# Patient Record
Sex: Female | Born: 1955 | Hispanic: No | Smoking: Never smoker
Health system: Southern US, Community
[De-identification: ages and names within clinical notes are randomized; demographics above are authoritative.]

## PROBLEM LIST (undated history)

## (undated) DIAGNOSIS — I10 Essential (primary) hypertension: Secondary | ICD-10-CM

---

## 2016-11-20 ENCOUNTER — Emergency Department (HOSPITAL_BASED_OUTPATIENT_CLINIC_OR_DEPARTMENT_OTHER): Admit: 2016-11-20 | Discharge: 2016-11-20 | Disposition: A | Discharge status: Home / Self Care | Payer: Self-pay

## 2016-11-20 ENCOUNTER — Emergency Department (HOSPITAL_COMMUNITY): Payer: Self-pay

## 2016-11-20 ENCOUNTER — Encounter (HOSPITAL_COMMUNITY): Payer: Self-pay

## 2016-11-20 ENCOUNTER — Emergency Department (HOSPITAL_COMMUNITY)
Admission: EM | Admit: 2016-11-20 | Discharge: 2016-11-20 | Disposition: A | Discharge status: Home / Self Care | Payer: Self-pay | Attending: Emergency Medicine | Admitting: Emergency Medicine

## 2016-11-20 DIAGNOSIS — S32020A Wedge compression fracture of second lumbar vertebra, initial encounter for closed fracture: Secondary | ICD-10-CM | POA: Insufficient documentation

## 2016-11-20 DIAGNOSIS — Y939 Activity, unspecified: Secondary | ICD-10-CM | POA: Insufficient documentation

## 2016-11-20 DIAGNOSIS — I10 Essential (primary) hypertension: Secondary | ICD-10-CM | POA: Insufficient documentation

## 2016-11-20 DIAGNOSIS — Y999 Unspecified external cause status: Secondary | ICD-10-CM | POA: Insufficient documentation

## 2016-11-20 DIAGNOSIS — R609 Edema, unspecified: Secondary | ICD-10-CM

## 2016-11-20 DIAGNOSIS — X58XXXA Exposure to other specified factors, initial encounter: Secondary | ICD-10-CM | POA: Insufficient documentation

## 2016-11-20 DIAGNOSIS — R600 Localized edema: Secondary | ICD-10-CM | POA: Insufficient documentation

## 2016-11-20 DIAGNOSIS — Y929 Unspecified place or not applicable: Secondary | ICD-10-CM | POA: Insufficient documentation

## 2016-11-20 HISTORY — DX: Essential (primary) hypertension: I10

## 2016-11-20 LAB — CBC WITH DIFFERENTIAL/PLATELET
BASOS ABS: 0 10*3/uL (ref 0.0–0.1)
Basophils Relative: 0 %
Eosinophils Absolute: 0.3 10*3/uL (ref 0.0–0.7)
Eosinophils Relative: 3 %
HEMATOCRIT: 40.7 % (ref 36.0–46.0)
HEMOGLOBIN: 13.2 g/dL (ref 12.0–15.0)
LYMPHS ABS: 3.5 10*3/uL (ref 0.7–4.0)
LYMPHS PCT: 36 %
MCH: 27.8 pg (ref 26.0–34.0)
MCHC: 32.4 g/dL (ref 30.0–36.0)
MCV: 85.9 fL (ref 78.0–100.0)
Monocytes Absolute: 0.8 10*3/uL (ref 0.1–1.0)
Monocytes Relative: 8 %
NEUTROS ABS: 5 10*3/uL (ref 1.7–7.7)
NEUTROS PCT: 53 %
PLATELETS: 300 10*3/uL (ref 150–400)
RBC: 4.74 MIL/uL (ref 3.87–5.11)
RDW: 14.3 % (ref 11.5–15.5)
WBC: 9.6 10*3/uL (ref 4.0–10.5)

## 2016-11-20 LAB — BASIC METABOLIC PANEL
ANION GAP: 6 (ref 5–15)
BUN: 10 mg/dL (ref 6–20)
CHLORIDE: 104 mmol/L (ref 101–111)
CO2: 29 mmol/L (ref 22–32)
Calcium: 9.8 mg/dL (ref 8.9–10.3)
Creatinine, Ser: 0.69 mg/dL (ref 0.44–1.00)
GFR calc Af Amer: >60 mL/min (ref >60)
Glucose, Bld: 96 mg/dL (ref 65–99)
POTASSIUM: 4.5 mmol/L (ref 3.5–5.1)
SODIUM: 139 mmol/L (ref 135–145)

## 2016-11-20 MED ORDER — MELOXICAM 7.5 MG PO TABS: 7.5000 mg | ORAL_TABLET | Freq: Every day | ORAL | 0 refills | Status: AC

## 2016-11-20 NOTE — ED Provider Notes (Signed)
[facility] DEPT Provider Note   CSN: [id number] Arrival date & time: 11/20/16  1505     History   Chief Complaint Chief Complaint  Patient presents with  . Back Pain  . Leg Swelling    HPI Karen Malone is a 61 y.o. female.  The history is provided by the patient. No language interpreter was used.  Back Pain   This is a new problem. The current episode started more than 1 week ago. The problem occurs constantly. The problem has been gradually worsening. The pain is associated with no known injury. The pain is present in the lumbar spine. The quality of the pain is described as aching. The pain is moderate. The symptoms are aggravated by bending. Associated symptoms include abdominal pain. Pertinent negatives include no chest pain. She has tried nothing for the symptoms.  Pt moved here from [place] 1 week ago.  Pt reports flying for 18 hours.  No injuries.   Past Medical History:  Diagnosis Date  . Hypertension     There are no active problems to display for this patient.   History reviewed. No pertinent surgical history.  OB History    No data available       Home Medications    Prior to Admission medications   Medication Sig Start Date End Date Taking? Authorizing Provider  meloxicam (MOBIC) 7.5 MG tablet Take 1 tablet (7.5 mg total) by mouth daily. 11/20/16   Elson AreasSofia, Ezel Vallone K, PA-C    Family History No family history on file.  Social History Social History  Substance Use Topics  . Smoking status: Never Smoker  . Smokeless tobacco: Never Used  . Alcohol use No     Allergies   Other   Review of Systems Review of Systems  Cardiovascular: Negative for chest pain.  Gastrointestinal: Positive for abdominal pain.  Musculoskeletal: Positive for back pain.  All other systems reviewed and are negative.    Physical Exam Updated Vital Signs BP (!) 102/57 (BP Location: Right Arm)   Pulse 75   Temp 97.9 F (36.6 C) (Oral)   Resp 18   Ht 5\' 4"  (1.626  m)   Wt 72.6 kg (160 lb)   SpO2 95%   BMI 27.46 kg/m   Physical Exam  Constitutional: She appears well-developed and well-nourished. No distress.  HENT:  Head: Normocephalic and atraumatic.  Eyes: Conjunctivae are normal.  Neck: Neck supple.  Cardiovascular: Normal rate and regular rhythm.   No murmur heard. Pulmonary/Chest: Effort normal and breath sounds normal. No respiratory distress.  Abdominal: Soft. There is no tenderness.  Musculoskeletal: She exhibits tenderness. She exhibits no edema.  Diffusely tender ls spine,  Tender bilat knees,  Negative homan's bilat   Neurological: She is alert.  Skin: Skin is warm and dry.  Psychiatric: She has a normal mood and affect.  Nursing note and vitals reviewed.    ED Treatments / Results  Labs (all labs ordered are listed, but only abnormal results are displayed) Labs Reviewed  BASIC METABOLIC PANEL  CBC WITH DIFFERENTIAL/PLATELET    EKG  EKG Interpretation None       Radiology Dg Lumbar Spine Complete  Result Date: 11/20/2016 CLINICAL DATA:  Pain in the back for 1 week EXAM: LUMBAR SPINE - COMPLETE 4+ VIEW COMPARISON:  None. FINDINGS: SI joints are symmetric. Lumbar alignment is within normal limits. Marked degenerative changes at L5-S1. Age indeterminate superior endplate compression deformities at L2 and L3. Superior endplate compression at L5 with mild  diffuse loss of height of the L5 vertebral body. Aortic atherosclerosis. IMPRESSION: 1. Age indeterminate superior endplate compression fractures of L2 and L3 2. Age-indeterminate superior endplate deformity L5, mild diffuse loss of vertebral body height at L5 3. 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   On: 11/20/2016 20:39    Procedures Procedures (including critical care time)  Medications Ordered in ED Medications - No data to display   Initial Impression / Assessment and Plan / ED Course  I have reviewed the  triage vital signs and the nursing notes.  Pertinent labs & imaging results that were available during my care of the patient were reviewed by me and considered in my medical decision making (see chart for details).    Bilat dopplers are negative.  LS spine xray shows old compression fractures and arthritic changes.   I will try pt on meloxicam.  I advised follow up with primary care MD.   Final Clinical Impressions(s) / ED Diagnoses   Final diagnoses:  Closed compression fracture of second lumbar vertebra, initial encounter [facility](HCC)  Leg edema    New Prescriptions Discharge Medication List as of 11/20/2016  8:58 PM    START taking these medications   Details  meloxicam (MOBIC) 7.5 MG tablet Take 1 tablet (7.5 mg total) by mouth daily., Starting Thu 11/20/2016, Print      An After Visit Summary was printed and given to the patient.   Elson AreasSofia, Eliazer Hemphill K, PA-C 11/20/16 2227    Elson AreasSofia, Moncerrat Burnstein K, PA-C 11/20/16 2230    Rolland PorterJames, Mark, MD 12/01/16 [phone]

## 2016-11-20 NOTE — Discharge Instructions (Signed)
Return if any problems.  Elevate legs to decrease swelling. Avoid salty food.

## 2016-11-20 NOTE — ED Triage Notes (Signed)
Per Pt, pt is coming from living with her family. She recently came over to [place] from [place] five days ago. Pt reports the traveling being 18 hours. Since the traveling, pt reports lower back pain and bilateral leg swelling. Family has been treating leg swelling with epsom salts and the swelling has slightly decreased, but the back pain has continued. Denies urinary symptoms.

## 2016-11-20 NOTE — Progress Notes (Signed)
VASCULAR LAB PRELIMINARY  PRELIMINARY  PRELIMINARY  PRELIMINARY  Bilateral lower extremity venous duplex completed.    Preliminary report:  There is no DVT or SVT noted in the bilateral lower extremities.   Wyatte Dames, RVT 11/20/2016, 7:05 PM

## 2017-01-05 ENCOUNTER — Emergency Department (HOSPITAL_COMMUNITY): Payer: Self-pay

## 2017-01-05 ENCOUNTER — Emergency Department (HOSPITAL_COMMUNITY)
Admission: EM | Admit: 2017-01-05 | Discharge: 2017-01-06 | Disposition: A | Discharge status: Home / Self Care | Payer: Self-pay | Attending: Emergency Medicine | Admitting: Emergency Medicine

## 2017-01-05 ENCOUNTER — Encounter (HOSPITAL_COMMUNITY): Payer: Self-pay | Admitting: Nurse Practitioner

## 2017-01-05 DIAGNOSIS — R51 Headache: Secondary | ICD-10-CM | POA: Insufficient documentation

## 2017-01-05 DIAGNOSIS — R0609 Other forms of dyspnea: Secondary | ICD-10-CM | POA: Insufficient documentation

## 2017-01-05 DIAGNOSIS — I10 Essential (primary) hypertension: Secondary | ICD-10-CM | POA: Insufficient documentation

## 2017-01-05 DIAGNOSIS — R519 Headache, unspecified: Secondary | ICD-10-CM

## 2017-01-05 DIAGNOSIS — R1013 Epigastric pain: Secondary | ICD-10-CM | POA: Insufficient documentation

## 2017-01-05 LAB — CBC WITH DIFFERENTIAL/PLATELET
Basophils Absolute: 0 10*3/uL (ref 0.0–0.1)
Basophils Relative: 0 %
EOS PCT: 3 %
Eosinophils Absolute: 0.3 10*3/uL (ref 0.0–0.7)
HCT: 40.6 % (ref 36.0–46.0)
Hemoglobin: 13.1 g/dL (ref 12.0–15.0)
LYMPHS ABS: 4.3 10*3/uL — AB (ref 0.7–4.0)
LYMPHS PCT: 40 %
MCH: 27.7 pg (ref 26.0–34.0)
MCHC: 32.3 g/dL (ref 30.0–36.0)
MCV: 85.8 fL (ref 78.0–100.0)
MONO ABS: 0.8 10*3/uL (ref 0.1–1.0)
Monocytes Relative: 8 %
Neutro Abs: 5.2 10*3/uL (ref 1.7–7.7)
Neutrophils Relative %: 49 %
Platelets: 259 10*3/uL (ref 150–400)
RBC: 4.73 MIL/uL (ref 3.87–5.11)
RDW: 14.2 % (ref 11.5–15.5)
WBC: 10.7 10*3/uL — ABNORMAL HIGH (ref 4.0–10.5)

## 2017-01-05 NOTE — ED Notes (Signed)
Patient transported to X-ray 

## 2017-01-05 NOTE — ED Triage Notes (Signed)
Pt presents with son with c/o headache. The headaches began last week and have been intermittent since onset. She describes as a hot pain on the top of her head. She's had intermittent sweats and chills, difficulty sleeping. She denies nausea. She is concerned that the headaches are related to her blood pressure, but does not check her blood pressure regularly at home.  The patient speaks arabic.

## 2017-01-05 NOTE — ED Notes (Signed)
Pt called for room x3 no answer nurse first RN aware

## 2017-01-05 NOTE — ED Provider Notes (Signed)
[facility] Provider Note   CSN: [id number] Arrival date & time: 01/05/17  1454     History   Chief Complaint Chief Complaint  Patient presents with  . Headache    HPI Karen Malone is a 61 y.o. female.  Non-english speaking patient here with her son who is interpreting. She has a medical history of chronic back pain from a previous fall injury only, here with multiple complaints. First, she has intermittent, random episodes where she has a burning type headache at the top of her head, becomes woosy but has no syncope, becomes hot then cold. She is concerned her blood pressure is a problem. Symptoms can happen at any time. Second, she is having DOE x 8 months. She denies CP during these episodes but reports she can become "sweaty". No nausea, syncope or near syncope. The DOE is not directly associated with episodes of headache. She reports infrequent, bilateral LE swelling, mostly associated with traveling between here and [place]. No history of blood clots, unilateral leg swelling. Symptoms intermittent x months. Third, she complains of epigastric abdominal pain that occurs when she takes any of her back medications. The pain will prevent her from eating temporarily. No vomiting or nausea. No melena. She takes meloxicam and/or Advil for back pain. No pain currently, including no current headache.    The history is provided by the patient. No language interpreter was used.  Headache   Associated symptoms include shortness of breath. Pertinent negatives include no fever, no nausea and no vomiting.    Past Medical History:  Diagnosis Date  . Hypertension     There are no active problems to display for this patient.   History reviewed. No pertinent surgical history.  OB History    No data available       Home Medications    Prior to Admission medications   Medication Sig Start Date End Date Taking? Authorizing Provider  meloxicam (MOBIC) 7.5 MG tablet Take 1 tablet  (7.5 mg total) by mouth daily. 11/20/16   Elson Areas, PA-C    Family History History reviewed. No pertinent family history.  Social History Social History  Substance Use Topics  . Smoking status: Never Smoker  . Smokeless tobacco: Never Used  . Alcohol use No     Allergies   Other   Review of Systems Review of Systems  Constitutional: Positive for appetite change (See HPI.). Negative for chills and fever.  HENT: Negative.   Eyes: Negative.  Negative for photophobia and visual disturbance.  Respiratory: Positive for shortness of breath. Negative for cough.   Cardiovascular: Positive for leg swelling. Negative for chest pain.  Gastrointestinal: Positive for abdominal pain. Negative for nausea and vomiting.  Musculoskeletal: Positive for back pain (Chronic, unchanged).  Skin: Negative.   Neurological: Positive for light-headedness and headaches. Negative for syncope, facial asymmetry and speech difficulty.     Physical Exam Updated Vital Signs BP 125/71 (BP Location: Right Arm)   Pulse 93   Temp 97.8 F (36.6 C) (Oral)   Resp 20   SpO2 95%   Physical Exam  Constitutional: She is oriented to person, place, and time. She appears well-developed and well-nourished.  HENT:  Head: Normocephalic.  Neck: Normal range of motion. Neck supple.  Cardiovascular: Normal rate and regular rhythm.   Pulmonary/Chest: Effort normal and breath sounds normal. She has no wheezes. She has no rales.  Abdominal: Soft. Bowel sounds are normal. There is no tenderness. There is no rebound and no  guarding.  Musculoskeletal: Normal range of motion. She exhibits no edema.  Neurological: She is alert and oriented to person, place, and time. No sensory deficit. She exhibits normal muscle tone. Coordination normal.  CN's 3-12 grossly intact. Speech is clear and focused. No facial asymmetry. No lateralizing weakness. Reflexes are equal. No deficits of coordination. Ambulatory without imbalance.      Skin: Skin is warm and dry. No rash noted.  Psychiatric: She has a normal mood and affect.     ED Treatments / Results  Labs (all labs ordered are listed, but only abnormal results are displayed) Labs Reviewed  CBC WITH DIFFERENTIAL/PLATELET  COMPREHENSIVE METABOLIC PANEL  TROPONIN I  BRAIN NATRIURETIC PEPTIDE   Results for orders placed or performed during the hospital encounter of 01/05/17  CBC with Differential  Result Value Ref Range   WBC 10.7 (H) 4.0 - 10.5 K/uL   RBC 4.73 3.87 - 5.11 MIL/uL   Hemoglobin 13.1 12.0 - 15.0 g/dL   HCT 81.1 91.4 - 78.2 %   MCV 85.8 78.0 - 100.0 fL   MCH 27.7 26.0 - 34.0 pg   MCHC 32.3 30.0 - 36.0 g/dL   RDW 95.6 21.3 - 08.6 %   Platelets 259 150 - 400 K/uL   Neutrophils Relative % 49 %   Neutro Abs 5.2 1.7 - 7.7 K/uL   Lymphocytes Relative 40 %   Lymphs Abs 4.3 (H) 0.7 - 4.0 K/uL   Monocytes Relative 8 %   Monocytes Absolute 0.8 0.1 - 1.0 K/uL   Eosinophils Relative 3 %   Eosinophils Absolute 0.3 0.0 - 0.7 K/uL   Basophils Relative 0 %   Basophils Absolute 0.0 0.0 - 0.1 K/uL  Comprehensive metabolic panel  Result Value Ref Range   Sodium 137 135 - 145 mmol/L   Potassium 3.6 3.5 - 5.1 mmol/L   Chloride 103 101 - 111 mmol/L   CO2 26 22 - 32 mmol/L   Glucose, Bld 83 65 - 99 mg/dL   BUN 14 6 - 20 mg/dL   Creatinine, Ser 5.78 0.44 - 1.00 mg/dL   Calcium 9.0 8.9 - 46.9 mg/dL   Total Protein 7.6 6.5 - 8.1 g/dL   Albumin 3.9 3.5 - 5.0 g/dL   AST 20 15 - 41 U/L   ALT 17 14 - 54 U/L   Alkaline Phosphatase 53 38 - 126 U/L   Total Bilirubin 0.5 0.3 - 1.2 mg/dL   GFR calc non Af Amer >60 >60 mL/min   GFR calc Af Amer >60 >60 mL/min   Anion gap 8 5 - 15  Troponin I  Result Value Ref Range   Troponin I <0.03 <0.03 ng/mL  Brain natriuretic peptide  Result Value Ref Range   B Natriuretic Peptide 17.3 0.0 - 100.0 pg/mL     EKG  EKG Interpretation None       Radiology No results found.  Procedures Procedures (including  critical care time)  Medications Ordered in ED Medications - No data to display   Initial Impression / Assessment and Plan / ED Course  I have reviewed the triage vital signs and the nursing notes.  Pertinent labs & imaging results that were available during my care of the patient were reviewed by me and considered in my medical decision making (see chart for details).     Patient with multiple complaints including intermittent headache; DOE x 8 months w/o chest pain; epigastric abdominal pain with NSAID use. Currently asymptomatic. She is from  [place] and travels back and forth staying months when she travels. She takes Mobic only, for back pain.   DOE: consider CHF vs deconditioning of age. Will look at BNP, check troponin/EKG, CXR.  Intermittent HA sometimes associated with "woosy", like she is going to fall. Symptoms also x months. Doubt IC origin, consider labile blood pressure vs blood sugar fluctuations. No symptoms of CVA. Will check basic labs, including chemistries, blood sugar - monitor blood pressure, check orthostatics.   Epigastric abdominal pain. Likely due to chronic NSAID use. Asymptomatic currently. Will consider starting on carafate and/or Prilosec. Refer to GI for possible EGD to evaluate for ulcer.  Labs, CXR, EKG all normal. Blood pressures have been normal, negative orthostatics. She remains asymptomatic.   She can be discharged home. Will provide Rx Carafate. Discussed use of NSAIDs as likely contributing to epigastric pain. Refer to PCP.  Final Clinical Impressions(s) / ED Diagnoses   Final diagnoses:  None   1. Nonspecific headache 2. DOE 3. Epigastric abdominal pain  New Prescriptions New Prescriptions   No medications on file     Elpidio AnisUpstill, Maleki Hippe, Cordelia Poche-C 01/06/17 0154    Mancel BaleWentz, Elliott, MD 01/06/17 [phone]

## 2017-01-06 LAB — BRAIN NATRIURETIC PEPTIDE: B Natriuretic Peptide: 17.3 pg/mL (ref 0.0–100.0)

## 2017-01-06 LAB — COMPREHENSIVE METABOLIC PANEL
ALK PHOS: 53 U/L (ref 38–126)
ALT: 17 U/L (ref 14–54)
ANION GAP: 8 (ref 5–15)
AST: 20 U/L (ref 15–41)
Albumin: 3.9 g/dL (ref 3.5–5.0)
BILIRUBIN TOTAL: 0.5 mg/dL (ref 0.3–1.2)
BUN: 14 mg/dL (ref 6–20)
CALCIUM: 9 mg/dL (ref 8.9–10.3)
CO2: 26 mmol/L (ref 22–32)
Chloride: 103 mmol/L (ref 101–111)
Creatinine, Ser: 0.76 mg/dL (ref 0.44–1.00)
GFR calc Af Amer: >60 mL/min (ref >60)
GLUCOSE: 83 mg/dL (ref 65–99)
POTASSIUM: 3.6 mmol/L (ref 3.5–5.1)
Sodium: 137 mmol/L (ref 135–145)
TOTAL PROTEIN: 7.6 g/dL (ref 6.5–8.1)

## 2017-01-06 LAB — TROPONIN I

## 2017-01-06 MED ORDER — SUCRALFATE 1 GM/10ML PO SUSP: 1.0000 g | Freq: Three times a day (TID) | ORAL | 0 refills | Status: AC

## 2017-01-06 NOTE — ED Notes (Signed)
Pt verbalized understanding of d/c instructions and has no further questions. Pt is stable, A&Ox4, VSS.  

## 2019-01-13 IMAGING — DX DG CHEST 2V
2 series · 2 of 2 positions shown · non-contrast
Comparison: None.

CLINICAL DATA: Dyspnea on exertion, headache and dizziness x1 week.

EXAM:
CHEST  2 VIEW

[w chest pa]
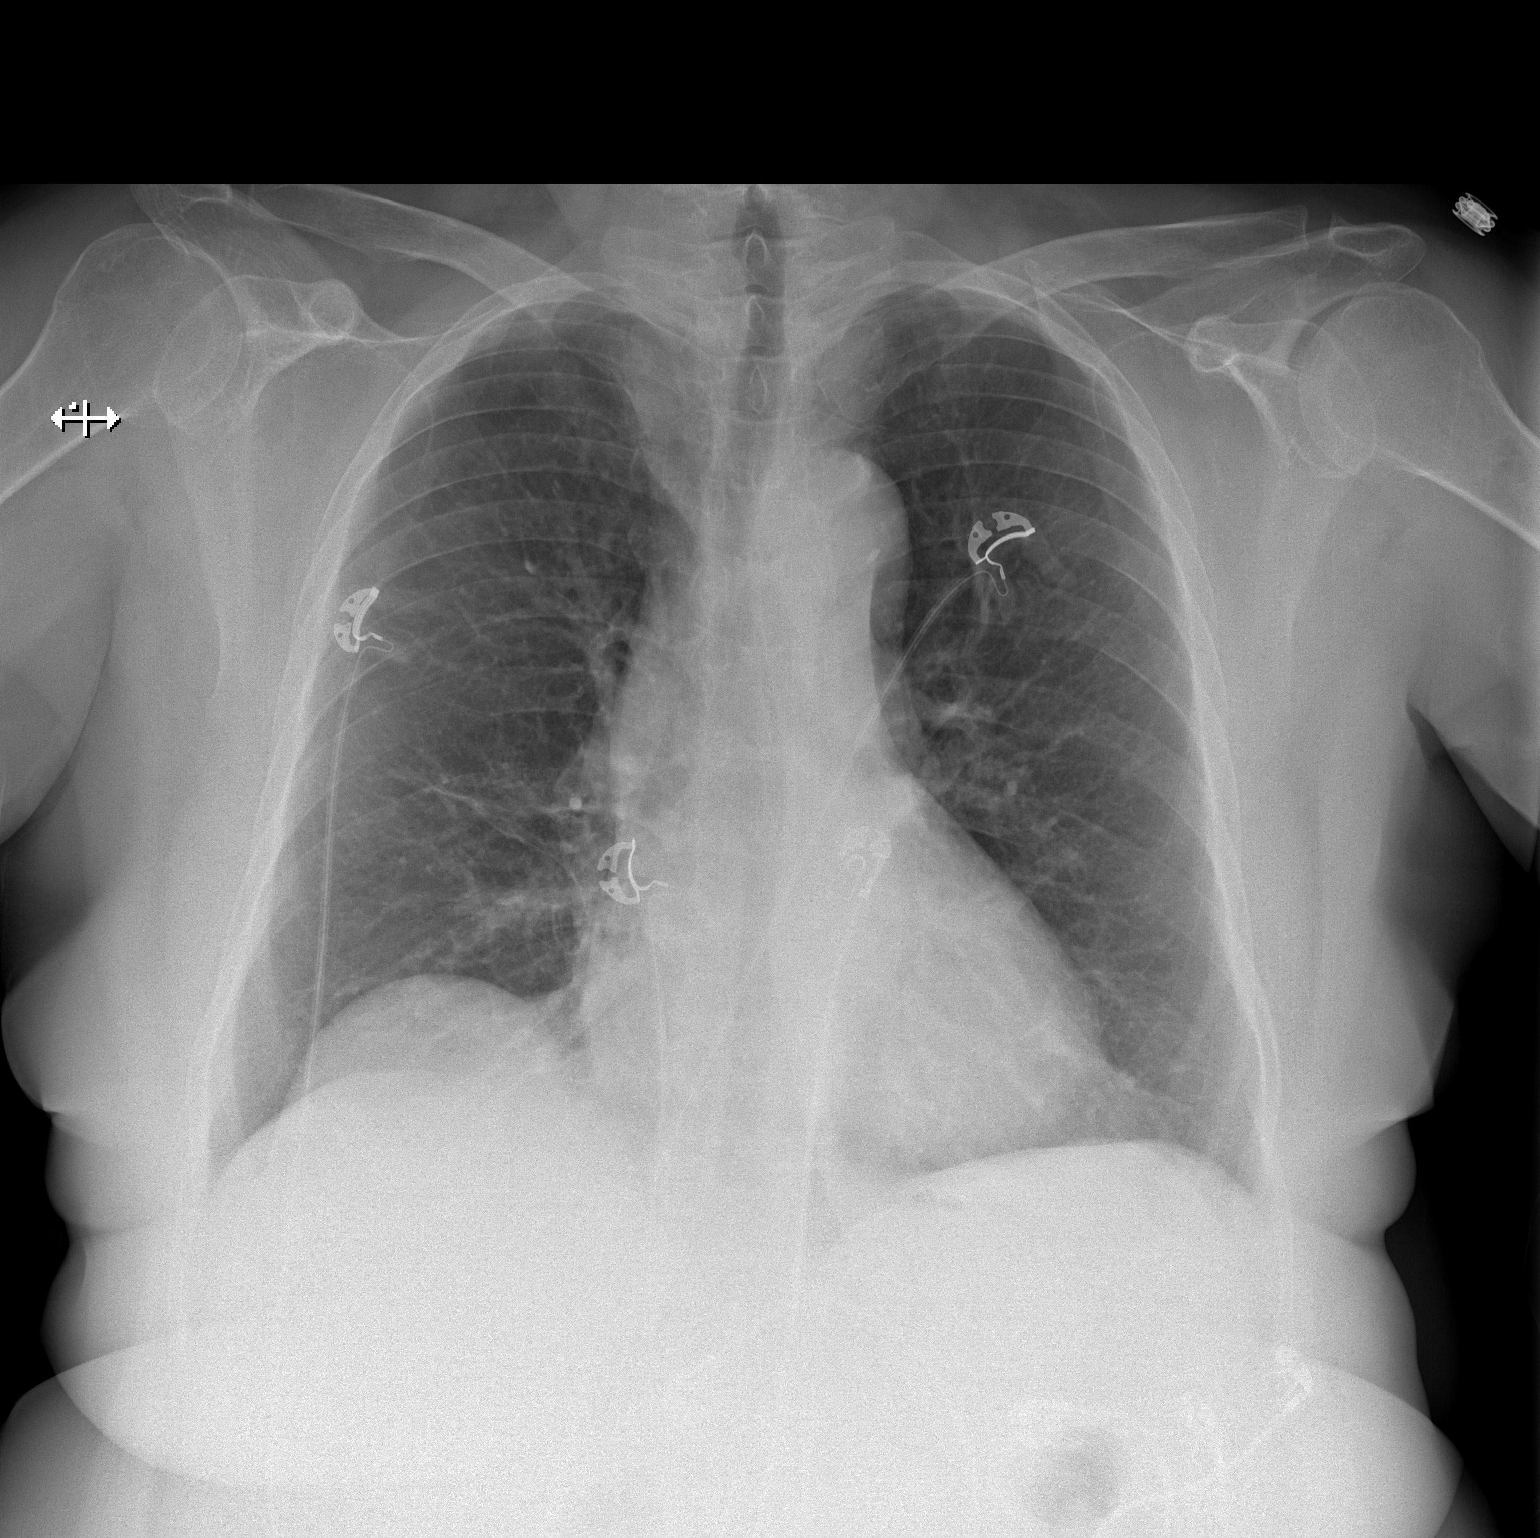

[w chest lat]
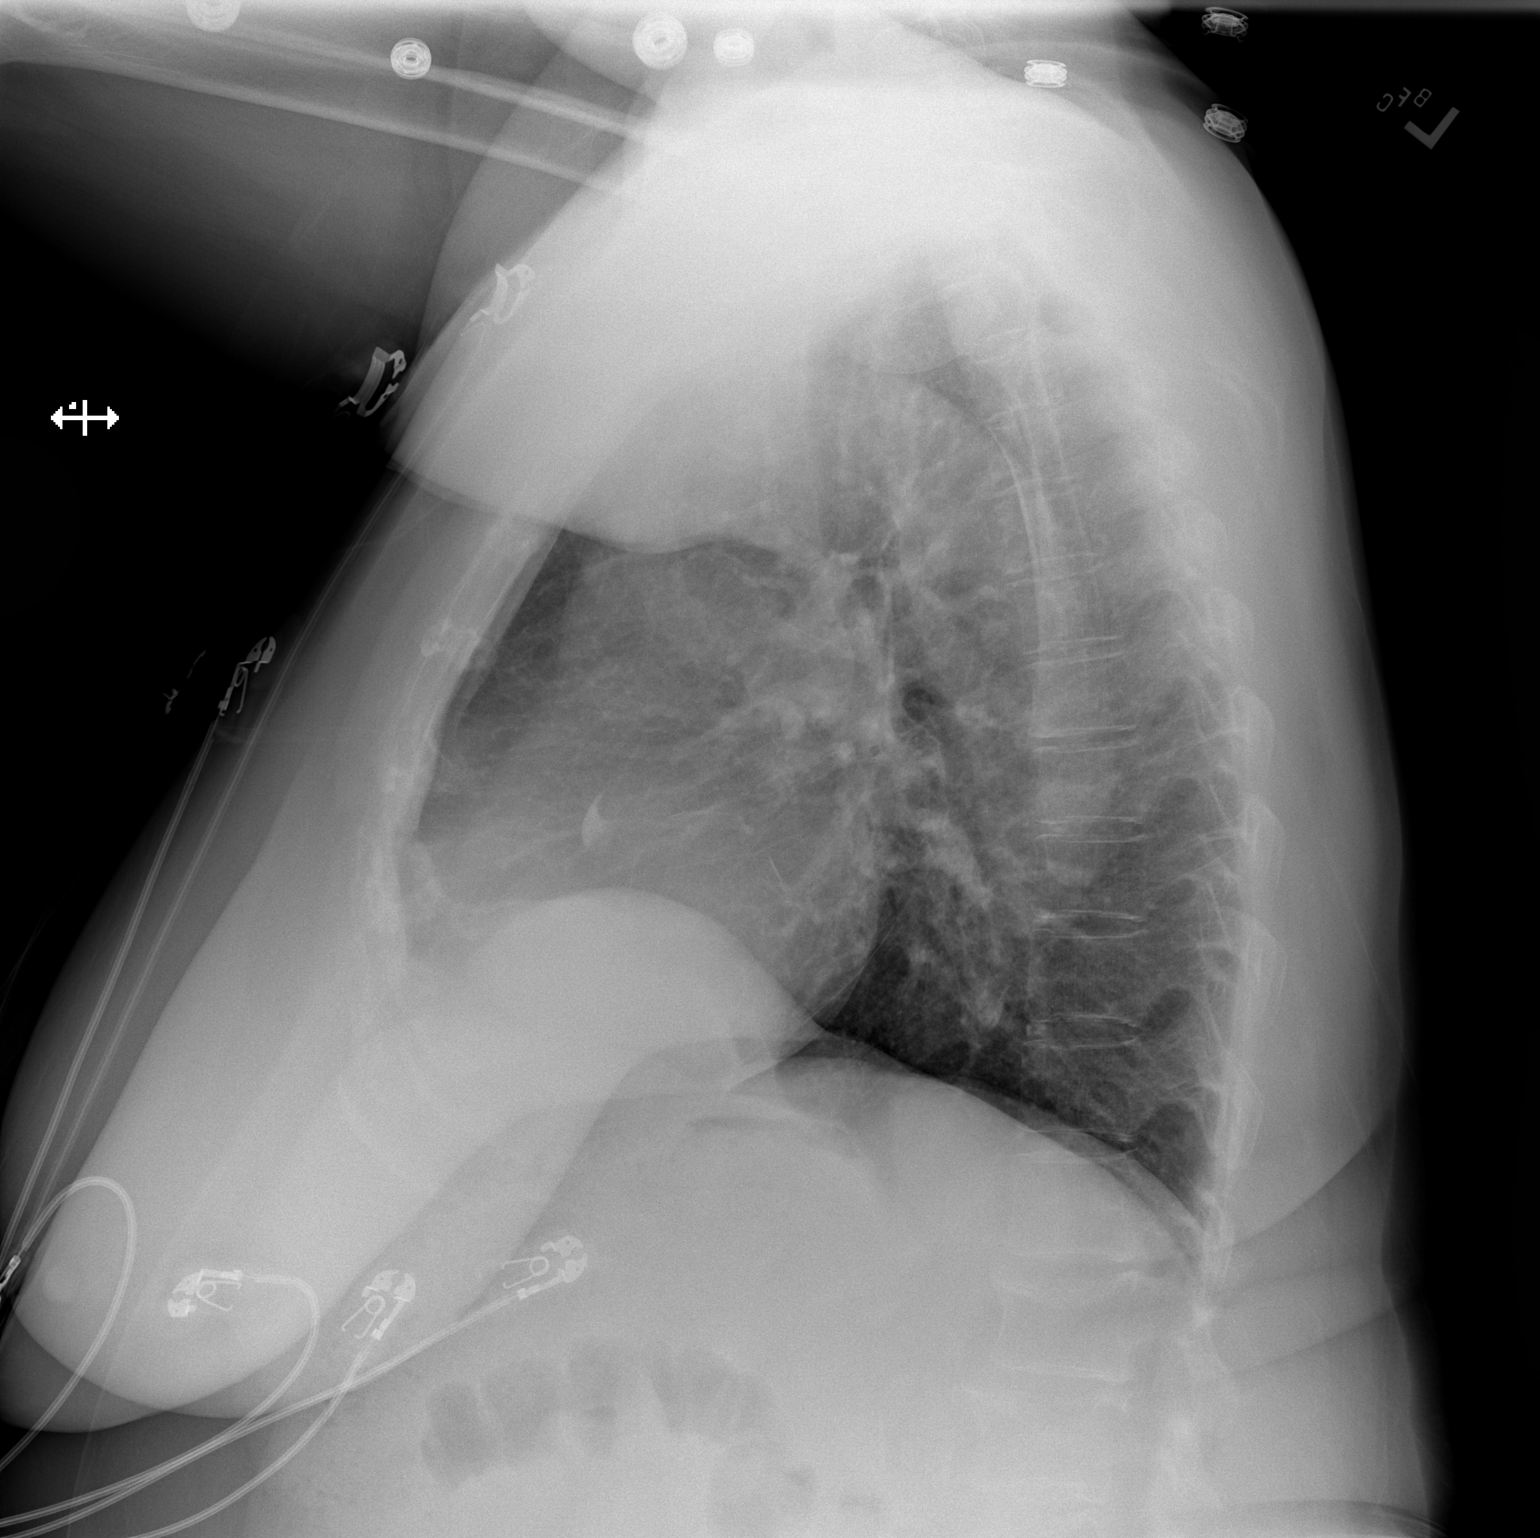

[2 of 2 positions shown; findings below may reference images not displayed]

FINDINGS: The heart size and mediastinal contours are within normal limits.
Aortic atherosclerosis at the arch without aneurysm. Mild
emphysematous hyperinflation of the lungs. Linear scarring is noted
about the right hilum and right middle lobe. There is eventration of
the right hemidiaphragm. No acute nor suspicious osseous
abnormalities.
IMPRESSION: No active cardiopulmonary disease. Emphysematous hyperinflation of
the lungs.
